# Patient Record
Sex: Female | Born: 2000 | Hispanic: No | Marital: Single | Smoking: Never smoker
Health system: Southern US, Community
[De-identification: ages and names within clinical notes are randomized; demographics above are authoritative.]

## PROBLEM LIST (undated history)

## (undated) ENCOUNTER — Inpatient Hospital Stay: Payer: Self-pay

---
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
## 2023-04-02 ENCOUNTER — Other Ambulatory Visit: Payer: Self-pay

## 2023-04-02 ENCOUNTER — Emergency Department
Admission: EM | Admit: 2023-04-02 | Discharge: 2023-04-02 | Disposition: A | Discharge status: Home / Self Care | Payer: Self-pay | Attending: Emergency Medicine | Admitting: Emergency Medicine

## 2023-04-02 DIAGNOSIS — L02412 Cutaneous abscess of left axilla: Secondary | ICD-10-CM | POA: Insufficient documentation

## 2023-04-02 MED ORDER — CEPHALEXIN 500 MG PO CAPS
500.0000 mg | ORAL_CAPSULE | Freq: Once | ORAL | Status: AC
  Administered 2023-04-02: 500 mg via ORAL
  Filled 2023-04-02: qty 1

## 2023-04-02 MED ORDER — LIDOCAINE HCL (PF) 1 % IJ SOLN
5.0000 mL | Freq: Once | INTRAMUSCULAR | Status: AC
  Administered 2023-04-02: 5 mL
  Filled 2023-04-02: qty 5

## 2023-04-02 MED ORDER — IBUPROFEN 800 MG PO TABS
800.0000 mg | ORAL_TABLET | Freq: Once | ORAL | Status: AC
Start: 2023-04-02 — End: 2023-04-02
  Administered 2023-04-02: 800 mg via ORAL
  Filled 2023-04-02: qty 1

## 2023-04-02 MED ORDER — CEPHALEXIN 500 MG PO CAPS: 500.0000 mg | ORAL_CAPSULE | Freq: Four times a day (QID) | ORAL | 0 refills | Status: AC

## 2023-04-02 NOTE — Discharge Instructions (Addendum)
 You were evaluated in the ED for a abscess in the left armpit area.  We successfully performed an incision and drainage and was able to drain a moderate amount of pus from the area.  Please review incision and drainage care after packet education attached to your discharge papers.  Continue to use warm compresses to the area to continue draining.  Wash the area twice daily with gentle soap and water.  You are encouraged to change dressing twice a day.  The size of the abscess should decrease over 5 days.  Please monitor for symptoms of developing fever, and spreading of redness.  You have been prescribed antibiotics.  Please take this as instructed even if symptoms improve until dosage is complete.

## 2023-04-02 NOTE — ED Notes (Signed)
 Pt ambulatory to Ed with c/o boil in left axillary. Pt reports it has been there approx 1 week. Offers hx of boils in other locations. Reports that it started as a small bump and it has progressed. Offers sensitive to touch. Respirations even and non labored. Denies any needs at this time. CB within reach. Pt encouraged to alert staff to any needs.

## 2023-04-02 NOTE — ED Triage Notes (Signed)
 Patient C/O abscess under left arm. Patient denies any fever, nausea, or vomiting.

## 2023-04-02 NOTE — ED Provider Notes (Signed)
 [facility] Emergency Department Provider Note     Event Date/Time   First MD Initiated Contact with Patient 04/02/23 2031     (approximate)   History   Abscess   HPI  Tammy Mata is a 23 y.o. female with no significant past medical history presents to the ED for evaluation of an abscess under her left axillary x 1 week with spontaneous drainage.      Physical Exam   Triage Vital Signs: ED Triage Vitals  Encounter Vitals Group     BP 04/02/23 2008 107/63     Systolic BP Percentile --      Diastolic BP Percentile --      Pulse Rate 04/02/23 2008 80     Resp 04/02/23 2008 18     Temp 04/02/23 2008 97.8 F (36.6 C)     Temp Source 04/02/23 2008 Oral     SpO2 04/02/23 2008 100 %     Weight 04/02/23 2003 135 lb (61.2 kg)     Height 04/02/23 2003 5\' 3"  (1.6 m)     Head Circumference --      Peak Flow --      Pain Score --      Pain Loc --      Pain Education --      Exclude from Growth Chart --     Most recent vital signs: Vitals:   04/02/23 2008  BP: 107/63  Pulse: 80  Resp: 18  Temp: 97.8 F (36.6 C)  SpO2: 100%    General Awake, no distress.  HEENT NCAT. PERRL. EOMI. No rhinorrhea. Mucous membranes are moist.  CV:  Good peripheral perfusion.  RESP:  Normal effort.  ABD:  No distention.  Other:  Left maxillary reveals a 3 x 2 cm mobile, fluctuant mass that is tender to palpitation.    ED Results / Procedures / Treatments   Labs (all labs ordered are listed, but only abnormal results are displayed) Labs Reviewed - No data to display  No results found.  PROCEDURES:  Critical Care performed: No  .Incision and Drainage  Date/Time: 04/02/2023 10:21 PM  Performed by: Kern Reap A, PA-C Authorized by: Kern Reap A, PA-C   Consent:    Consent obtained:  Verbal   Consent given by:  Patient   Risks discussed:  Bleeding, incomplete drainage, pain and infection Location:    Type:  Abscess   Size:  4cm    Location:  Upper extremity   Upper extremity location:  Arm   Arm location:  L upper arm (axillary) Pre-procedure details:    Skin preparation:  Povidone-iodine Sedation:    Sedation type:  None Anesthesia:    Anesthesia method:  Local infiltration   Local anesthetic:  Lidocaine 1% w/o epi Procedure type:    Complexity:  Simple Procedure details:    Incision types:  Stab incision   Incision depth:  Dermal   Wound management:  Irrigated with saline   Drainage:  Purulent   Drainage amount:  Copious   Wound treatment:  Wound left open   Packing materials:  None Post-procedure details:    Procedure completion:  Tolerated   MEDICATIONS ORDERED IN ED: Medications  lidocaine (PF) (XYLOCAINE) 1 % injection 5 mL (5 mLs Infiltration Given 04/02/23 2113)  ibuprofen (ADVIL) tablet 800 mg (800 mg Oral Given 04/02/23 2146)  cephALEXin (KEFLEX) capsule 500 mg (500 mg Oral Given 04/02/23 2146)     IMPRESSION / MDM /  ASSESSMENT AND PLAN / ED COURSE  I reviewed the triage vital signs and the nursing notes.                               23 y.o. female presents to the emergency department for evaluation and treatment of left axillary abscess. See HPI for further details.   Differential diagnosis includes, but is not limited to abscess, cyst, cellulitis  Patient's presentation is most consistent with acute complicated illness / injury requiring diagnostic workup.  Patient is alert and oriented.  She is hemodynamically stable and afebrile.  Physical exam findings are as stated above.  Successful incision and drainage performed.  Please see procedure note.  First dose of Keflex given in ED.  After incision and drainage care education provided to patient.  Patient verbalized understanding.  Patient is in stable condition for discharge home.  Encouraged to follow-up with PCP for further management as needed.  ED return precautions discussed thoroughly.  All questions concerns were addressed during  this ED visit.  FINAL CLINICAL IMPRESSION(S) / ED DIAGNOSES   Final diagnoses:  Abscess of left axilla   Rx / DC Orders   ED Discharge Orders          Ordered    cephALEXin (KEFLEX) 500 MG capsule  4 times daily        04/02/23 2143            Note:  This document was prepared using Dragon voice recognition software and may include unintentional dictation errors.    Romeo Apple, Roslynn Holte A, PA-C 04/02/23 2229    Sharman Cheek, MD 04/02/23 [phone]

## 2023-05-07 ENCOUNTER — Emergency Department
Admission: EM | Admit: 2023-05-07 | Discharge: 2023-05-07 | Disposition: A | Discharge status: Against Medical Advice | Payer: Self-pay | Attending: Emergency Medicine | Admitting: Emergency Medicine

## 2023-05-07 ENCOUNTER — Other Ambulatory Visit: Payer: Self-pay

## 2023-05-07 ENCOUNTER — Emergency Department: Payer: Self-pay

## 2023-05-07 DIAGNOSIS — Z5321 Procedure and treatment not carried out due to patient leaving prior to being seen by health care provider: Secondary | ICD-10-CM | POA: Insufficient documentation

## 2023-05-07 DIAGNOSIS — O209 Hemorrhage in early pregnancy, unspecified: Secondary | ICD-10-CM | POA: Insufficient documentation

## 2023-05-07 DIAGNOSIS — Z3A01 Less than 8 weeks gestation of pregnancy: Secondary | ICD-10-CM | POA: Insufficient documentation

## 2023-05-07 LAB — CBC
HCT: 36.1 % (ref 36.0–46.0)
Hemoglobin: 12 g/dL (ref 12.0–15.0)
MCH: 28.6 pg (ref 26.0–34.0)
MCHC: 33.2 g/dL (ref 30.0–36.0)
MCV: 86.2 fL (ref 80.0–100.0)
Platelets: 283 10*3/uL (ref 150–400)
RBC: 4.19 MIL/uL (ref 3.87–5.11)
RDW: 12.3 % (ref 11.5–15.5)
WBC: 10.2 10*3/uL (ref 4.0–10.5)
nRBC: 0 % (ref 0.0–0.2)

## 2023-05-07 LAB — COMPREHENSIVE METABOLIC PANEL WITH GFR
ALT: 13 U/L (ref 0–44)
AST: 15 U/L (ref 15–41)
Albumin: 4.4 g/dL (ref 3.5–5.0)
Alkaline Phosphatase: 31 U/L — ABNORMAL LOW (ref 38–126)
Anion gap: 8 (ref 5–15)
BUN: 11 mg/dL (ref 6–20)
CO2: 23 mmol/L (ref 22–32)
Calcium: 9.1 mg/dL (ref 8.9–10.3)
Chloride: 106 mmol/L (ref 98–111)
Creatinine, Ser: 0.67 mg/dL (ref 0.44–1.00)
GFR, Estimated: >60 mL/min (ref >60)
Glucose, Bld: 107 mg/dL — ABNORMAL HIGH (ref 70–99)
Potassium: 3.3 mmol/L — ABNORMAL LOW (ref 3.5–5.1)
Sodium: 137 mmol/L (ref 135–145)
Total Bilirubin: 1.3 mg/dL — ABNORMAL HIGH (ref 0.0–1.2)
Total Protein: 6.9 g/dL (ref 6.5–8.1)

## 2023-05-07 LAB — HCG, QUANTITATIVE, PREGNANCY: hCG, Beta Chain, Quant, S: 10821 m[IU]/mL — ABNORMAL HIGH (ref <5)

## 2023-05-07 LAB — POC URINE PREG, ED: Preg Test, Ur: POSITIVE — AB

## 2023-05-07 NOTE — ED Triage Notes (Signed)
 Pt from home to ED for vaginal bleeding.  Pt states she is estimated [redacted] weeks pregnant based off of last menstrual cycle and positive urine pregnancy test. Pt states she has been having intermittent sharp pains in L upper abdomen/flank for x2 days.  Pt not tender to palpation.  Pt denies /N /V /D.  Pt reports history of elective abortion at 17 weeks approximately 2 years ago.

## 2023-11-26 ENCOUNTER — Encounter: Payer: Self-pay | Admitting: Obstetrics and Gynecology

## 2023-11-26 ENCOUNTER — Observation Stay
Admission: EM | Admit: 2023-11-26 | Discharge: 2023-11-26 | Disposition: A | Discharge status: Home / Self Care | Payer: Self-pay | Attending: Obstetrics and Gynecology | Admitting: Obstetrics and Gynecology

## 2023-11-26 ENCOUNTER — Other Ambulatory Visit: Payer: Self-pay

## 2023-11-26 DIAGNOSIS — O368190 Decreased fetal movements, unspecified trimester, not applicable or unspecified: Principal | ICD-10-CM | POA: Diagnosis present

## 2023-11-26 DIAGNOSIS — O368130 Decreased fetal movements, third trimester, not applicable or unspecified: Principal | ICD-10-CM | POA: Insufficient documentation

## 2023-11-26 DIAGNOSIS — Z3A35 35 weeks gestation of pregnancy: Secondary | ICD-10-CM | POA: Insufficient documentation

## 2023-11-26 LAB — WET PREP, GENITAL
Clue Cells Wet Prep HPF POC: NONE SEEN
Sperm: NONE SEEN
Trich, Wet Prep: NONE SEEN
WBC, Wet Prep HPF POC: <10 (ref <10)

## 2023-11-26 LAB — URINALYSIS, ROUTINE W REFLEX MICROSCOPIC
Bilirubin Urine: NEGATIVE
Glucose, UA: NEGATIVE mg/dL
Hgb urine dipstick: NEGATIVE
Ketones, ur: 20 mg/dL — AB
Nitrite: NEGATIVE
Protein, ur: NEGATIVE mg/dL
Specific Gravity, Urine: 1.015 (ref 1.005–1.030)
pH: 6 (ref 5.0–8.0)

## 2023-11-26 LAB — CHLAMYDIA/NGC RT PCR (ARMC ONLY)
Chlamydia Tr: NOT DETECTED
N gonorrhoeae: NOT DETECTED

## 2023-11-26 MED ORDER — FLUCONAZOLE 50 MG PO TABS
150.0000 mg | ORAL_TABLET | Freq: Once | ORAL | Status: AC
  Administered 2023-11-26: 150 mg via ORAL
  Filled 2023-11-26: qty 1

## 2023-11-26 NOTE — OB Triage Note (Signed)
 Patient is a  23 yo, G2 P1, at 35 weeks 2 days. Patient presents with complaints of decreased fetal movement and vaginal bleeding that began after intercourse last night.  Patient denies LOF.. Monitors applied and assessing. + VSS. Initial fetal heart tone 135 bpm. Aisha, CNM notified of patients arrival to unit. Plan to complete labs

## 2023-11-26 NOTE — Discharge Summary (Signed)
 Tammy Mata is a 23 y.o. female. She is at [redacted]w[redacted]d gestation. Patient's last menstrual period was 03/14/2023 (approximate). Estimated Date of Delivery: 12/29/23  Prenatal care site: [facility]  Chief complaint: decreased fetal movement and vaginal bleeding after intercourse  HPI: Tammy Mata presents to Labor and Delivery at 35.[redacted] weeks gestation with complaints of decreased fetal movement and vaginal bleeding that began after intercourse last night. She denies leakage of fluid, contractions, or abdominal pain. Fetal movement has improved since arrival.  Factors complicating pregnancy: Patient Active Problem List   Diagnosis Date Noted   Decreased fetal movement 11/26/2023     S: Resting comfortably. no CTX, no VB.no LOF,  Active fetal movement.   Maternal Medical History:  Past Medical Hx:  has no past medical history on file.    Past Surgical Hx:  has no past surgical history on file.   No Known Allergies   Prior to Admission medications   Not on File    Social History: She  reports that she has never smoked. She has never used smokeless tobacco. She reports that she does not currently use alcohol. She reports that she does not currently use drugs.  Family History: family history is not on file.   Review of Systems: A full review of systems was performed and negative except as noted in the HPI.    O:  BP (!) 114/49 (BP Location: Left Arm)   Pulse 61   Temp 98.1 F (36.7 C) (Oral)   Resp 16   LMP 03/14/2023 (Approximate)  Results for orders placed or performed during the hospital encounter of 11/26/23 (from the past 48 hours)  Wet prep, genital   Collection Time: 11/26/23  6:42 AM  Result Value Ref Range   Yeast Wet Prep HPF POC PRESENT (A) NONE SEEN   Trich, Wet Prep NONE SEEN NONE SEEN   Clue Cells Wet Prep HPF POC NONE SEEN NONE SEEN   WBC, Wet Prep HPF POC <10 <10   Sperm NONE SEEN   Chlamydia/NGC rt PCR ([facility] only)   Collection Time: 11/26/23  6:42 AM   Result Value Ref Range   Specimen source GC/Chlam ENDOCERVICAL    Chlamydia Tr NOT DETECTED NOT DETECTED   N gonorrhoeae NOT DETECTED NOT DETECTED  Urinalysis, Routine w reflex microscopic -Urine, Clean Catch   Collection Time: 11/26/23  6:42 AM  Result Value Ref Range   Color, Urine YELLOW (A) YELLOW   APPearance CLOUDY (A) CLEAR   Specific Gravity, Urine 1.015 1.005 - 1.030   pH 6.0 5.0 - 8.0   Glucose, UA NEGATIVE NEGATIVE mg/dL   Hgb urine dipstick NEGATIVE NEGATIVE   Bilirubin Urine NEGATIVE NEGATIVE   Ketones, ur 20 (A) NEGATIVE mg/dL   Protein, ur NEGATIVE NEGATIVE mg/dL   Nitrite NEGATIVE NEGATIVE   Leukocytes,Ua LARGE (A) NEGATIVE   RBC / HPF 0-5 0 - 5 RBC/hpf   WBC, UA 11-20 0 - 5 WBC/hpf   Bacteria, UA RARE (A) NONE SEEN   Squamous Epithelial / HPF 21-50 0 - 5 /HPF   Mucus PRESENT      Constitutional: NAD, AAOx3  HE/ENT: extraocular movements grossly intact, moist mucous membranes CV: RRR PULM: nl respiratory effort, CTABL Abd: gravid, non-tender, non-distended, soft  Ext: Non-tender, Nonedmeatous Psych: mood appropriate, speech normal Pelvic :no active bleeding SVE: Dilation: Closed Effacement (%): Thick Cervical Position: Middle Station: -3   Fetal monitoring: Prolonged about 2 hours Baseline FHR: 130 beats/min Variability: moderate Accelerations: present Decelerations:  absent Tocometry: irritability  Time: at least 20 minutes   Interpretation: Category I INDICATIONS: decreased fetal movement  Imaging Studies: No results found. Assessment/Plan  Patient is a 35.2-week gravid female presenting with decreased fetal movement and vaginal bleeding following intercourse last night. She denies leakage of fluid, contractions, or pain. On evaluation, fetal heart tracing was Category I and reassuring, with mild uterine irritability noted. No active vaginal bleeding was observed. Urinalysis was negative for infection, and wet prep was positive for yeast, for  which the patient was treated with a single dose of Diflucan.  She was reassured regarding fetal well-being and educated on fetal kick counts, emphasizing the need to feel at least 10 movements within 2 hours during her baby's active period. Pregnancy precautions were reviewed, including pelvic rest until symptoms resolve and instructions to return for vaginal bleeding, leakage of fluid, contractions, abdominal pain, or decreased fetal movement. Patient was stable at discharge with instructions to continue routine prenatal care and follow up with her primary OB provider as scheduled.  Dr Lovetta updated and agrees with plan  ----- Bobbette Brunswick, CNM Certified Nurse Midwife [facility]

## 2023-11-26 NOTE — OB Triage Note (Signed)
 Pt left floor ambulatory with significant other. Tammy Mata
